# Patient Record
Sex: Male | Born: 1985 | Race: White | Hispanic: No | Marital: Married | Smoking: Current every day smoker
Health system: Southern US, Community
[De-identification: ages and names within clinical notes are randomized; demographics above are authoritative.]

## PROBLEM LIST (undated history)

## (undated) ENCOUNTER — Ambulatory Visit: Admission: EM

---

## 2019-03-29 ENCOUNTER — Ambulatory Visit: Payer: Self-pay | Admitting: Physician Assistant

## 2019-03-29 ENCOUNTER — Other Ambulatory Visit: Payer: Self-pay

## 2019-03-29 DIAGNOSIS — Z113 Encounter for screening for infections with a predominantly sexual mode of transmission: Secondary | ICD-10-CM

## 2019-03-30 ENCOUNTER — Encounter: Payer: Self-pay | Admitting: Physician Assistant

## 2019-03-30 NOTE — Progress Notes (Signed)
    STI clinic/screening visit  Subjective:  Francis Evans is a 33 y.o. male being seen today for an STI screening visit. The patient reports they do have symptoms.  Patient has the following medical conditions:  There are no active problems to display for this patient.    Chief Complaint  Patient presents with  . SEXUALLY TRANSMITTED DISEASE    HPI  Patient reports that she has had "ball ache" 2 months ago, denies having any of these symptoms now.  Reports slight dysuria off and on for 2 weeks.  States that partner was having itching and use OTC yeast treatment with resolution of symptoms.  Denies other concerns today.  See flowsheet for further details and programmatic requirements.    The following portions of the patient's history were reviewed and updated as appropriate: allergies, current medications, past medical history, past social history, past surgical history and problem list.  Objective:  There were no vitals filed for this visit.  Physical Exam Constitutional:      General: He is not in acute distress.    Appearance: Normal appearance.  HENT:     Head: Normocephalic and atraumatic.     Mouth/Throat:     Mouth: Mucous membranes are moist.     Pharynx: Oropharynx is clear. No oropharyngeal exudate or posterior oropharyngeal erythema.  Eyes:     Conjunctiva/sclera: Conjunctivae normal.  Neck:     Musculoskeletal: Neck supple.  Pulmonary:     Effort: Pulmonary effort is normal.  Abdominal:     Palpations: Abdomen is soft. There is no mass.     Tenderness: There is no abdominal tenderness. There is no guarding or rebound.  Genitourinary:    Penis: Normal.      Scrotum/Testes: Normal.     Comments: Pubic area without nits, lice, edema, erythema, lesions and inguinal adenopathy. Penis circumcised and without discharge at meatus. Lymphadenopathy:     Cervical: No cervical adenopathy.  Skin:    General: Skin is warm and dry.     Findings: No bruising,  erythema, lesion or rash.  Neurological:     Mental Status: He is alert and oriented to person, place, and time.  Psychiatric:        Mood and Affect: Mood normal.        Behavior: Behavior normal.        Thought Content: Thought content normal.        Judgment: Judgment normal.       Assessment and Plan:  Hagop Mccollam is a 33 y.o. male presenting to the [facility] Department for STI screening  1. Screening for STD (sexually transmitted disease) Patient with symptoms that are off and on for approximately 2 weeks and normal exam. Rec no sex until results are available. Rec condoms with all sex. Await test results.  Counseled that RN will call if needs to RTC for any treatment after results are back. - Ct, Ng, Mycoplasmas NAA, Urine - HIV/HCV [facility] - Syphilis Serology, [facility]     No follow-ups on file.  No future appointments.  Jerene Dilling, PA

## 2019-04-02 LAB — CT, NG, MYCOPLASMAS NAA, URINE
Chlamydia trachomatis, NAA: NEGATIVE
Mycoplasma genitalium NAA: NEGATIVE
Mycoplasma hominis NAA: NEGATIVE
Neisseria gonorrhoeae, NAA: NEGATIVE
Ureaplasma spp NAA: NEGATIVE

## 2019-04-09 ENCOUNTER — Telehealth: Payer: Self-pay | Admitting: General Practice

## 2019-04-09 LAB — HM HEPATITIS C SCREENING LAB: HM Hepatitis Screen: NEGATIVE

## 2019-04-09 LAB — HM HIV SCREENING LAB: HM HIV Screening: NEGATIVE

## 2019-04-09 NOTE — Telephone Encounter (Signed)
TC with patient. Verified ID via password. Informed patient of negative results from last visit.  Reports is still having "ball pain" and dysuria. Instructed to f/u with PCP or urologist. Patient verbalized understanding Aileen Fass, RN

## 2019-04-09 NOTE — Telephone Encounter (Signed)
Phone call to pt. Left message on voicemail that RN with [facility] is returning a phone call. Can call us back at [phone] if still need assistance or may be able to access some information in MyChart if signed up.

## 2019-04-09 NOTE — Telephone Encounter (Signed)
Calling to see if Test Results were avaliable

## 2020-04-10 ENCOUNTER — Ambulatory Visit (INDEPENDENT_AMBULATORY_CARE_PROVIDER_SITE_OTHER): Payer: Managed Care, Other (non HMO)

## 2020-04-10 ENCOUNTER — Ambulatory Visit
Admission: EM | Admit: 2020-04-10 | Discharge: 2020-04-10 | Disposition: A | Discharge status: Home / Self Care | Payer: Managed Care, Other (non HMO) | Attending: Internal Medicine | Admitting: Internal Medicine

## 2020-04-10 ENCOUNTER — Other Ambulatory Visit: Payer: Self-pay

## 2020-04-10 DIAGNOSIS — S8992XA Unspecified injury of left lower leg, initial encounter: Secondary | ICD-10-CM

## 2020-04-10 DIAGNOSIS — S8392XA Sprain of unspecified site of left knee, initial encounter: Secondary | ICD-10-CM | POA: Diagnosis not present

## 2020-04-10 MED ORDER — NABUMETONE 750 MG PO TABS: 750.0000 mg | ORAL_TABLET | Freq: Two times a day (BID) | ORAL | 0 refills | Status: DC
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
## 2020-04-10 NOTE — Discharge Instructions (Signed)
Rest and elevate the affected painful area.  Apply cold compresses intermittently as needed for up to 48h after the injry.  As pain recedes, begin normal activities slowly as tolerated.   Follow up with orthopedics tomorrow or next week.

## 2022-04-03 ENCOUNTER — Ambulatory Visit: Payer: Self-pay
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
## 2023-10-16 ENCOUNTER — Encounter: Payer: Self-pay | Admitting: Emergency Medicine

## 2023-10-16 ENCOUNTER — Ambulatory Visit
Admission: EM | Admit: 2023-10-16 | Discharge: 2023-10-16 | Disposition: A | Discharge status: Home / Self Care | Attending: Family Medicine | Admitting: Family Medicine

## 2023-10-16 DIAGNOSIS — S161XXA Strain of muscle, fascia and tendon at neck level, initial encounter: Secondary | ICD-10-CM | POA: Diagnosis not present

## 2023-10-16 MED ORDER — CYCLOBENZAPRINE HCL 10 MG PO TABS: 10.0000 mg | ORAL_TABLET | Freq: Two times a day (BID) | ORAL | 0 refills | Status: DC | PRN

## 2023-10-16 MED ORDER — NAPROXEN 500 MG PO TABS: 500.0000 mg | ORAL_TABLET | Freq: Two times a day (BID) | ORAL | 0 refills | Status: DC | PRN

## 2023-10-16 MED ORDER — KETOROLAC TROMETHAMINE 30 MG/ML IJ SOLN
30.0000 mg | Freq: Once | INTRAMUSCULAR | Status: AC
  Administered 2023-10-16: 30 mg via INTRAMUSCULAR
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
## 2023-10-16 NOTE — Discharge Instructions (Addendum)
 You were given a Toradol injection in clinic today. Do not take any over the counter NSAID's such as Advil, ibuprofen, Aleve, or naproxen for 24 hours. You may take tylenol if needed.  You may start Flexeril today as needed.  Please note this medication will make you drowsy.  Do not drink alcohol or drive while on this medication.  May start naproxen twice daily as needed tomorrow, 4/7.  Lots of rest and you may use heat to the neck as needed.  Please follow-up with your PCP if your symptoms do not improve.  Please go to the ER for any worsening symptoms.  Hope you feel better soon!

## 2024-01-21 ENCOUNTER — Ambulatory Visit
Admission: EM | Admit: 2024-01-21 | Discharge: 2024-01-21 | Disposition: A | Discharge status: Home / Self Care | Attending: Emergency Medicine | Admitting: Emergency Medicine

## 2024-01-21 ENCOUNTER — Encounter: Payer: Self-pay | Admitting: Emergency Medicine

## 2024-01-21 DIAGNOSIS — Z23 Encounter for immunization: Secondary | ICD-10-CM | POA: Diagnosis not present

## 2024-01-21 DIAGNOSIS — S058X1A Other injuries of right eye and orbit, initial encounter: Secondary | ICD-10-CM | POA: Diagnosis not present

## 2024-01-21 MED ORDER — TETANUS-DIPHTH-ACELL PERTUSSIS 5-2.5-18.5 LF-MCG/0.5 IM SUSY
0.5000 mL | PREFILLED_SYRINGE | Freq: Once | INTRAMUSCULAR | Status: AC
  Administered 2024-01-21: 0.5 mL via INTRAMUSCULAR

## 2024-01-21 NOTE — ED Provider Notes (Signed)
 [facility]    CSN: [id number] Arrival date & time: 01/21/24  0802      History   Chief Complaint Chief Complaint  Patient presents with   Eye Injury    Right eye    HPI Francis Evans is a 38 y.o. male.   HPI  38 year old male with no significant past medical history presents for evaluation of an injury to his right eye that he sustained last night.  He reports that he was removing an outlet and a plastic piece flew off and struck him in his eye.  The eye is red, swollen, and there is pain.  He is light sensitive.  He states he has some mildly blurry vision and he has a lot of tearing.  He is unsure when his last tetanus vaccination was.  History reviewed. No pertinent past medical history.  There are no active problems to display for this patient.   History reviewed. No pertinent surgical history.     Home Medications    Prior to Admission medications   Not on File    Family History History reviewed. No pertinent family history.  Social History Social History   Tobacco Use   Smoking status: Every Day    Types: Cigarettes   Smokeless tobacco: Never  Vaping Use   Vaping status: Every Day  Substance Use Topics   Alcohol use: Yes    Comment: occasionally   Drug use: Yes    Types: Marijuana, Cocaine     Allergies   Patient has no known allergies.   Review of Systems Review of Systems  Eyes:  Positive for photophobia, pain, discharge, redness and visual disturbance.     Physical Exam Triage Vital Signs ED Triage Vitals  Encounter Vitals Group     BP      Girls Systolic BP Percentile      Girls Diastolic BP Percentile      Boys Systolic BP Percentile      Boys Diastolic BP Percentile      Pulse      Resp      Temp      Temp src      SpO2      Weight      Height      Head Circumference      Peak Flow      Pain Score      Pain Loc      Pain Education      Exclude from Growth Chart    No data found.  Updated  Vital Signs BP 119/71 (BP Location: Right Arm)   Pulse 70   Temp 98 F (36.7 C) (Oral)   Resp 15   Ht 5' 8 (1.727 m)   Wt 160 lb (72.6 kg)   SpO2 97%   BMI 24.33 kg/m   Visual Acuity Right Eye Distance: 20/25 uncorrected Left Eye Distance: 20/20 uncorrected Bilateral Distance: 20/20 uncorrected  Right Eye Near:   Left Eye Near:    Bilateral Near:     Physical Exam Vitals and nursing note reviewed.  Constitutional:      Appearance: Normal appearance. He is not ill-appearing.  HENT:     Head: Normocephalic and atraumatic.  Eyes:     General: No scleral icterus.       Right eye: Discharge present.        Left eye: No discharge.     Extraocular Movements: Extraocular movements intact.     Conjunctiva/sclera:  Conjunctivae normal.     Pupils: Pupils are equal, round, and reactive to light.     Comments: Right eye sclera is erythematous and there is copious tearing from the eye.  Pupils equal round reactive, EOM is intact, and there is a normal red light reflex in the left eye.  There is some questionable pus discharge from the lacrimal duct near the inner canthus.  Neurological:     Mental Status: He is alert.      UC Treatments / Results  Labs (all labs ordered are listed, but only abnormal results are displayed) Labs Reviewed - No data to display  EKG   Radiology No results found.  Procedures Procedures (including critical care time)  Medications Ordered in UC Medications  Tdap (BOOSTRIX) injection 0.5 mL (0.5 mLs Intramuscular Given 01/21/24 0828)    Initial Impression / Assessment and Plan / UC Course  I have reviewed the triage vital signs and the nursing notes.  Pertinent labs & imaging results that were available during my care of the patient were reviewed by me and considered in my medical decision making (see chart for details).   Patient is a pleasant 38 year old male presenting for evaluation of trauma to the right eye that he sustained yesterday  as outlined in the HPI above.  As you can see in image above, there is erythema to the sclera and there is some periorbital edema and swelling.  Bulbar and labral conjunctiva are erythematous and injected.  After anesthetizing eye with 2 drops of tetracaine I did instill some fluorescein dye and performed a Woods lamp examination.  The patient has a vertical corneal laceration just lateral to the midline that is approximately 1/2 cm in length.  Visual acuity OU 20/20, OD 20/25, OS 20/20.  I have sent a secure epic chat message to Dr. Jaye who is on for unassigned call for ophthalmology regarding management of this patient.  I am unsure if this patient needs to be seen urgently today in clinic or if patient discharged home on anti-inflammatories, antibiotics, and have him follow-up early next week.  I will have staff update his tetanus shot.  I have spoken with Dr. Jaye who is also concerned that the patient may have sustained a laceration to his cornea.  He will see me [facility] in [place] office this afternoon at 1 PM.   Final Clinical Impressions(s) / UC Diagnoses   Final diagnoses:  Corneal injury of right eye, initial encounter     Discharge Instructions      I have discussed your case with the ophthalmologist on-call, Dr. Jaye and he feels that he needs to evaluate you today.  She will meet you at 1 PM this afternoon at the [place] office of [facility].  The address is [address], [place], [place].  [address].  Phone number [phone].  The front doors will be locked and he has asked you to come around to the side door where he will need to.  In the meantime, avoid rubbing your eye to prevent further injury.  Wear sunglasses to help with the light sensitivity.     ED Prescriptions   None    PDMP not reviewed this encounter.   Bernardino Ditch, NP 01/21/24 [phone]
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
## 2024-01-21 NOTE — ED Triage Notes (Signed)
 Patient states that he was removing an outlet and a plastic piece hit his right eye yesterday.  Patient reports redness, swelling and pain in his right eye.

## 2024-05-11 ENCOUNTER — Ambulatory Visit: Admission: EM | Admit: 2024-05-11 | Discharge: 2024-05-11 | Disposition: A | Discharge status: Home / Self Care

## 2024-05-11 DIAGNOSIS — S0181XA Laceration without foreign body of other part of head, initial encounter: Secondary | ICD-10-CM

## 2024-05-11 DIAGNOSIS — S0990XA Unspecified injury of head, initial encounter: Secondary | ICD-10-CM

## 2024-05-11 NOTE — ED Triage Notes (Signed)
 Pt is with his wife  Pt c/o injury to forehead   Pt states that he was laying on the ground playing with his dog when a flower pot fell on his head.  Pt states that he saw purple lights, but did not black out  Pt states that he saw white and is worried about his eye.

## 2024-05-11 NOTE — ED Provider Notes (Signed)
 [facility]    CSN: [id number] Arrival date & time: 05/11/24  1355      History   Chief Complaint Chief Complaint  Patient presents with   Head Injury    HPI Francis Evans is a 38 y.o. male.   HPI  38 year old male with no significant past medical history presents for evaluation of closed head injury that occurred yesterday morning.  He reports that he was roughhousing with his dog when a fire pot fell off the shelf and struck him on the head.  He did sustain a laceration to his right eyebrow which is closed with a butterfly bandage.  He denies loss of consciousness, changes in vision, changes in behavior, N/V.  History reviewed. No pertinent past medical history.  There are no active problems to display for this patient.   History reviewed. No pertinent surgical history.     Home Medications    Prior to Admission medications   Not on File    Family History History reviewed. No pertinent family history.  Social History Social History   Tobacco Use   Smoking status: Every Day    Types: Cigarettes   Smokeless tobacco: Never  Vaping Use   Vaping status: Never Used  Substance Use Topics   Alcohol use: Yes    Comment: occasionally   Drug use: Yes    Types: Marijuana, Cocaine     Allergies   Patient has no known allergies.   Review of Systems Review of Systems  Eyes:  Negative for photophobia.  Gastrointestinal:  Negative for nausea and vomiting.  Skin:  Positive for wound.  Neurological:  Negative for syncope and headaches.  Psychiatric/Behavioral:  Negative for agitation, behavioral problems and confusion.      Physical Exam Triage Vital Signs ED Triage Vitals  Encounter Vitals Group     BP      Girls Systolic BP Percentile      Girls Diastolic BP Percentile      Boys Systolic BP Percentile      Boys Diastolic BP Percentile      Pulse      Resp      Temp      Temp src      SpO2      Weight      Height      Head  Circumference      Peak Flow      Pain Score      Pain Loc      Pain Education      Exclude from Growth Chart    No data found.  Updated Vital Signs BP (!) 115/93 (BP Location: Left Arm)   Pulse 82   Temp 98.5 F (36.9 C) (Oral)   Wt 164 lb 12.8 oz (74.8 kg)   SpO2 100%   BMI 25.06 kg/m   Visual Acuity Right Eye Distance:   Left Eye Distance:   Bilateral Distance:    Right Eye Near:   Left Eye Near:    Bilateral Near:     Physical Exam Vitals and nursing note reviewed.  Constitutional:      Appearance: Normal appearance. He is not ill-appearing.  Eyes:     General: No scleral icterus.       Right eye: No discharge.        Left eye: No discharge.     Extraocular Movements: Extraocular movements intact.     Conjunctiva/sclera: Conjunctivae normal.     Pupils: Pupils  are equal, round, and reactive to light.  Skin:    General: Skin is warm and dry.     Capillary Refill: Capillary refill takes less than 2 seconds.     Findings: No bruising or erythema.  Neurological:     General: No focal deficit present.     Mental Status: He is alert and oriented to person, place, and time.      UC Treatments / Results  Labs (all labs ordered are listed, but only abnormal results are displayed) Labs Reviewed - No data to display  EKG   Radiology No results found.  Procedures Procedures (including critical care time)  Medications Ordered in UC Medications - No data to display  Initial Impression / Assessment and Plan / UC Course  I have reviewed the triage vital signs and the nursing notes.  Pertinent labs & imaging results that were available during my care of the patient were reviewed by me and considered in my medical decision making (see chart for details).   Patient is a pleasant, nontoxic-appearing 53 old male presenting for evaluation of closed head injury and laceration to the right eyebrow as outlined in the HPI above.  Patient seen image above, there is  a laceration that is scabbed over near the medial aspect of the left eyebrow.  The incident occurred greater than 24 hours ago and therefore the laceration is unable to be sutured closed.  I advised him to leave the butterfly bandage in place and let it fall off on its own.  He should monitor for any evidence of infection such as redness, swelling, or pus drainage.  If these conditions arise he should return for reevaluation.  He was concerned about his eye but he is not having any changes in vision.  His pupils are equal round reactive to COVID is intact which does not suggest any potential entrapment.  He did not have a loss of consciousness.  And the wife denies any changes in behavior.  I will discharge him home with diagnosis of closed head injury and facial laceration.  He denies need for work note.   Final Clinical Impressions(s) / UC Diagnoses   Final diagnoses:  Closed head injury, initial encounter  Facial laceration, initial encounter     Discharge Instructions      Leave the butterfly bandage in place and let it fall off on its own.  Your laceration has developed a scab and will have to heal on its own.  Monitor for any indication of infection such as redness, swelling, or pus drainage from the wound.  Also if you develop a fever.  If these conditions arise please return for reevaluation.  You may apply ice to your face for 20 minutes at a time, 2-3 times a day, to help with any pain or inflammation.  You may also use over-the-counter Tylenol and/or ibuprofen as needed for pain or inflammation.     ED Prescriptions   None    PDMP not reviewed this encounter.   Bernardino Ditch, NP 05/11/24 1438

## 2024-05-11 NOTE — Discharge Instructions (Addendum)
 Leave the butterfly bandage in place and let it fall off on its own.  Your laceration has developed a scab and will have to heal on its own.  Monitor for any indication of infection such as redness, swelling, or pus drainage from the wound.  Also if you develop a fever.  If these conditions arise please return for reevaluation.  You may apply ice to your face for 20 minutes at a time, 2-3 times a day, to help with any pain or inflammation.  You may also use over-the-counter Tylenol and/or ibuprofen as needed for pain or inflammation.
